# Patient Record
Sex: Male | Born: 2000 | Race: White | Hispanic: No | Marital: Single | Smoking: Never smoker
Health system: Southern US, Community
[De-identification: ages and names within clinical notes are randomized; demographics above are authoritative.]

## PROBLEM LIST (undated history)

## (undated) DIAGNOSIS — J45909 Unspecified asthma, uncomplicated: Secondary | ICD-10-CM

## (undated) DIAGNOSIS — G259 Extrapyramidal and movement disorder, unspecified: Secondary | ICD-10-CM

## (undated) DIAGNOSIS — Z8614 Personal history of Methicillin resistant Staphylococcus aureus infection: Secondary | ICD-10-CM

## (undated) DIAGNOSIS — R51 Headache: Secondary | ICD-10-CM

## (undated) DIAGNOSIS — K4090 Unilateral inguinal hernia, without obstruction or gangrene, not specified as recurrent: Secondary | ICD-10-CM

## (undated) HISTORY — PX: CIRCUMCISION: SUR203

## (undated) HISTORY — DX: Extrapyramidal and movement disorder, unspecified: G25.9

## (undated) HISTORY — DX: Headache: R51

---
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
## 2019-01-13 ENCOUNTER — Other Ambulatory Visit (HOSPITAL_COMMUNITY): Payer: Self-pay | Admitting: Family Medicine

## 2019-01-13 DIAGNOSIS — N5089 Other specified disorders of the male genital organs: Secondary | ICD-10-CM

## 2019-01-16 ENCOUNTER — Ambulatory Visit (HOSPITAL_COMMUNITY)
Admission: RE | Admit: 2019-01-16 | Discharge: 2019-01-16 | Disposition: A | Discharge status: Home / Self Care | Payer: No Typology Code available for payment source | Source: Ambulatory Visit | Attending: Family Medicine | Admitting: Family Medicine

## 2019-01-16 ENCOUNTER — Other Ambulatory Visit: Payer: Self-pay

## 2019-01-16 DIAGNOSIS — N5089 Other specified disorders of the male genital organs: Secondary | ICD-10-CM | POA: Insufficient documentation

## 2019-05-29 ENCOUNTER — Other Ambulatory Visit: Payer: No Typology Code available for payment source

## 2019-12-27 DIAGNOSIS — Z68.41 Body mass index (BMI) pediatric, less than 5th percentile for age: Secondary | ICD-10-CM | POA: Diagnosis not present

## 2019-12-27 DIAGNOSIS — J019 Acute sinusitis, unspecified: Secondary | ICD-10-CM | POA: Diagnosis not present

## 2020-01-26 ENCOUNTER — Ambulatory Visit
Admission: EM | Admit: 2020-01-26 | Discharge: 2020-01-26 | Disposition: A | Discharge status: Home / Self Care | Payer: No Typology Code available for payment source

## 2020-01-29 DIAGNOSIS — U071 COVID-19: Secondary | ICD-10-CM | POA: Diagnosis not present

## 2020-06-11 ENCOUNTER — Other Ambulatory Visit: Payer: Self-pay

## 2020-06-11 ENCOUNTER — Ambulatory Visit
Admission: EM | Admit: 2020-06-11 | Discharge: 2020-06-11 | Disposition: A | Discharge status: Home / Self Care | Payer: BC Managed Care – PPO | Attending: Emergency Medicine | Admitting: Emergency Medicine

## 2020-06-11 ENCOUNTER — Encounter: Payer: Self-pay | Admitting: Emergency Medicine

## 2020-06-11 DIAGNOSIS — Z20822 Contact with and (suspected) exposure to covid-19: Secondary | ICD-10-CM

## 2020-06-11 DIAGNOSIS — J029 Acute pharyngitis, unspecified: Secondary | ICD-10-CM | POA: Diagnosis not present

## 2020-06-11 LAB — POCT RAPID STREP A (OFFICE): Rapid Strep A Screen: NEGATIVE

## 2020-06-11 NOTE — Discharge Instructions (Signed)
Your rapid strep test is negative.  A throat culture is pending; we will call you if it is positive requiring treatment.    Your COVID test is pending.  You should self quarantine until the test result is back.    Take Tylenol or ibuprofen as needed for fever or discomfort.  Rest and keep yourself hydrated.    Follow-up with your primary care provider if your symptoms are not improving.     

## 2020-06-11 NOTE — ED Triage Notes (Signed)
Intake completed by provider °

## 2020-06-11 NOTE — ED Provider Notes (Signed)
Franklin Bryan    CSN: [id number] Arrival date & time: 06/11/20  1003      History   Chief Complaint Chief Complaint  Patient presents with  . Sore Throat    HPI Franklin Bryan is a 20 y.o. male.   Patient presents with a sore throat since this morning.  He also reports a mild headache.  He denies fever, chills, rash, cough, wheezing, shortness of breath, vomiting, diarrhea, or other symptoms.  No treatments attempted at home.  His medical history includes asthma, migraines, movement disorder/tic.  The history is provided by the patient and medical records.    Past Medical History:  Diagnosis Date  . Asthma    sports-induced; prn inhaler/neb.  Marland Kitchen Headache(784.0)   . History of MRSA infection    as a young child  . Inguinal hernia 07/2011   left  . Movement disorder     Patient Active Problem List   Diagnosis Date Noted  . Generalized headaches 04/20/2013  . Migraine, unspecified, without mention of intractable migraine without mention of status migrainosus 04/20/2013  . 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 COLLES' FRACTURE, LEFT WRIST 10/10/2009    Past Surgical History:  Procedure Laterality Date  . CIRCUMCISION    . INGUINAL HERNIA REPAIR  08/26/2011   Procedure: HERNIA REPAIR INGUINAL PEDIATRIC;  Surgeon: Judie Petit. Leonia Corona, MD;  Location: [facility];  Service: Pediatrics;  Laterality: Left;  FAXED SURGICAL REQUEST SHEET DID NOT ASK FOR LAP LOOK ON RIGHT       Home Medications    Prior to Admission medications   Medication Sig Start Date End Date Taking? Authorizing Provider  acetaminophen-codeine (TYLENOL #3) 300-30 MG tablet Take 1 tablet by mouth every 6 (six) hours as needed for moderate pain. 03/27/18   Burgess Amor, PA-C  albuterol (PROAIR HFA) 108 (90 BASE) MCG/ACT inhaler Inhale 2 puffs into the lungs every 6 (six) hours as needed.    [provider]  albuterol (PROVENTIL) (5 MG/ML) 0.5% nebulizer solution Take 2.5 mg by  nebulization every 6 (six) hours as needed.    [provider]  cloNIDine (CATAPRES) 0.1 MG tablet Take one tablet in the morning, may take a second tablet in the morning or evening as needed for recurrent tics. 10/29/15   Deetta Perla, MD  ibuprofen (ADVIL,MOTRIN) 600 MG tablet Take 1 tablet (600 mg total) by mouth every 6 (six) hours as needed. 03/27/18   Burgess Amor, PA-C    Family History Family History  Problem Relation Age of Onset  . Asthma Maternal Grandmother   . Hypertension Paternal Grandmother   . Heart disease Paternal Grandmother   . Diabetes Paternal Grandfather   . Hypertension Paternal Grandfather   . Pancreatic cancer Paternal Grandfather        Died at 7  . Learning disabilities Mother        Dyslexia  . Learning disabilities Father        Dyslexia  . SIDS Brother     Social History Social History   Tobacco Use  . Smoking status: Never Smoker  . Smokeless tobacco: Never Used  Substance Use Topics  . Alcohol use: No  . Drug use: No     Allergies   Patient has no known allergies.   Review of Systems Review of Systems  Constitutional: Negative for chills and fever.  HENT: Positive for sore throat. Negative for ear pain.   Respiratory: Negative for cough, shortness of  breath and wheezing.   Cardiovascular: Negative for chest pain and palpitations.  Gastrointestinal: Negative for abdominal pain, diarrhea and vomiting.  Skin: Negative for color change and rash.  Neurological: Positive for headaches. Negative for seizures and syncope.  All other systems reviewed and are negative.    Physical Exam Triage Vital Signs ED Triage Vitals  Enc Vitals Group     BP      Pulse      Resp      Temp      Temp src      SpO2      Weight      Height      Head Circumference      Peak Flow      Pain Score      Pain Loc      Pain Edu?      Excl. in GC?    No data found.  Updated Vital Signs BP 135/87   Pulse 99   Temp 99 F (37.2 C)    Resp 19   SpO2 95%   Visual Acuity Right Eye Distance:   Left Eye Distance:   Bilateral Distance:    Right Eye Near:   Left Eye Near:    Bilateral Near:     Physical Exam Vitals and nursing note reviewed.  Constitutional:      General: He is not in acute distress.    Appearance: He is well-developed. He is not ill-appearing.  HENT:     Head: Normocephalic and atraumatic.     Right Ear: Tympanic membrane normal.     Left Ear: Tympanic membrane normal.     Nose: Nose normal.     Mouth/Throat:     Mouth: Mucous membranes are moist.     Pharynx: Posterior oropharyngeal erythema present.  Eyes:     Conjunctiva/sclera: Conjunctivae normal.  Cardiovascular:     Rate and Rhythm: Normal rate and regular rhythm.     Heart sounds: Normal heart sounds.  Pulmonary:     Effort: Pulmonary effort is normal. No respiratory distress.     Breath sounds: Normal breath sounds.  Abdominal:     Palpations: Abdomen is soft.     Tenderness: There is no abdominal tenderness.  Musculoskeletal:     Cervical back: Neck supple.  Skin:    General: Skin is warm and dry.     Findings: No rash.  Neurological:     General: No focal deficit present.     Mental Status: He is alert and oriented to person, place, and time.     Gait: Gait normal.  Psychiatric:        Mood and Affect: Mood normal.        Behavior: Behavior normal.      UC Treatments / Results  Labs (all labs ordered are listed, but only abnormal results are displayed) Labs Reviewed  CULTURE, GROUP A STREP ([facility])  NOVEL CORONAVIRUS, NAA  POCT RAPID STREP A (OFFICE)    EKG   Radiology No results found.  Procedures Procedures (including critical care time)  Medications Ordered in UC Medications - No data to display  Initial Impression / Assessment and Plan / UC Course  I have reviewed the triage vital signs and the nursing notes.  Pertinent labs & imaging results that were available during my care of the patient  were reviewed by me and considered in my medical decision making (see chart for details).   Sore throat, COVID test.  Rapid strep negative; culture pending.  COVID pending.  Instructed patient to self quarantine until the test result is back.  Discussed symptomatic treatment including Tylenol or ibuprofen, rest, hydration.  Instructed patient to follow up with PCP if his symptoms are not improving.  Patient agrees to plan of care.    Final Clinical Impressions(s) / UC Diagnoses   Final diagnoses:  Sore throat  Encounter for laboratory testing for COVID-19 virus     Discharge Instructions     Your rapid strep test is negative.  A throat culture is pending; we will call you if it is positive requiring treatment.    Your COVID test is pending.  You should self quarantine until the test result is back.    Take Tylenol or ibuprofen as needed for fever or discomfort.  Rest and keep yourself hydrated.    Follow-up with your primary care provider if your symptoms are not improving.        ED Prescriptions    None     PDMP not reviewed this encounter.   Mickie Bail, NP 06/11/20 1049

## 2020-06-13 DIAGNOSIS — J019 Acute sinusitis, unspecified: Secondary | ICD-10-CM | POA: Diagnosis not present

## 2020-06-13 DIAGNOSIS — U071 COVID-19: Secondary | ICD-10-CM | POA: Diagnosis not present

## 2020-06-13 LAB — NOVEL CORONAVIRUS, NAA: SARS-CoV-2, NAA: DETECTED — AB

## 2020-06-13 LAB — SARS-COV-2, NAA 2 DAY TAT

## 2020-06-14 ENCOUNTER — Telehealth: Payer: Self-pay | Admitting: Physician Assistant

## 2020-06-14 LAB — CULTURE, GROUP A STREP (THRC)

## 2020-06-14 NOTE — Telephone Encounter (Signed)
Called to discuss with patient about COVID-19 symptoms and the use of one of the available treatments for those with mild to moderate Covid symptoms and at a high risk of hospitalization.  Pt appears to qualify for outpatient treatment due to co-morbid conditions and/or a member of an at-risk group in accordance with the FDA Emergency Use Authorization.    Patient is feeling well and symptoms almost resolved.  Preventive service reviewed.   [employer]

## 2020-08-26 DIAGNOSIS — Z681 Body mass index (BMI) 19 or less, adult: Secondary | ICD-10-CM | POA: Diagnosis not present

## 2020-08-26 DIAGNOSIS — J029 Acute pharyngitis, unspecified: Secondary | ICD-10-CM | POA: Diagnosis not present

## 2020-10-04 IMAGING — US US SCROTUM W/ DOPPLER COMPLETE
1 series · 14 of 25 positions shown · non-contrast
Comparison: None.

CLINICAL DATA: Left-sided scrotal region pain

EXAM:
SCROTAL ULTRASOUND
DOPPLER ULTRASOUND OF THE TESTICLES
TECHNIQUE: Complete ultrasound examination of the testicles, epididymis, and
other scrotal structures was performed. Color and spectral Doppler
ultrasound were also utilized to evaluate blood flow to the
testicles.

[Series 1: us scrotum w/ doppler complete · 14 of 55 slices shown]
[im 1/55]
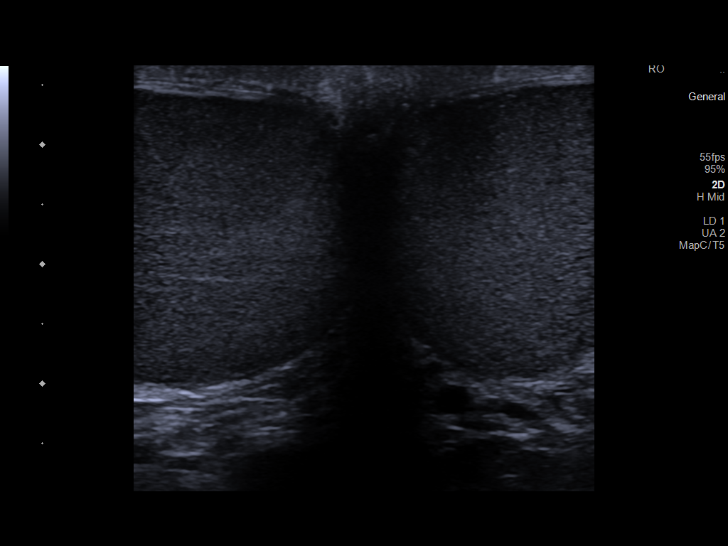
[im 5/55]
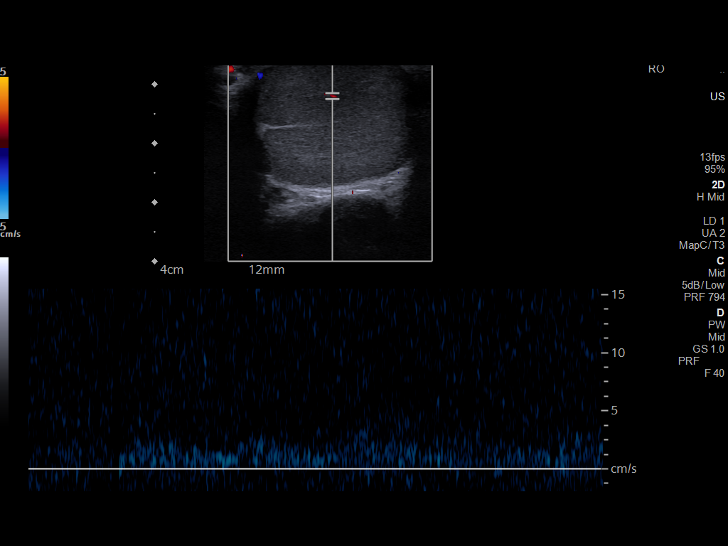
[im 10/55]
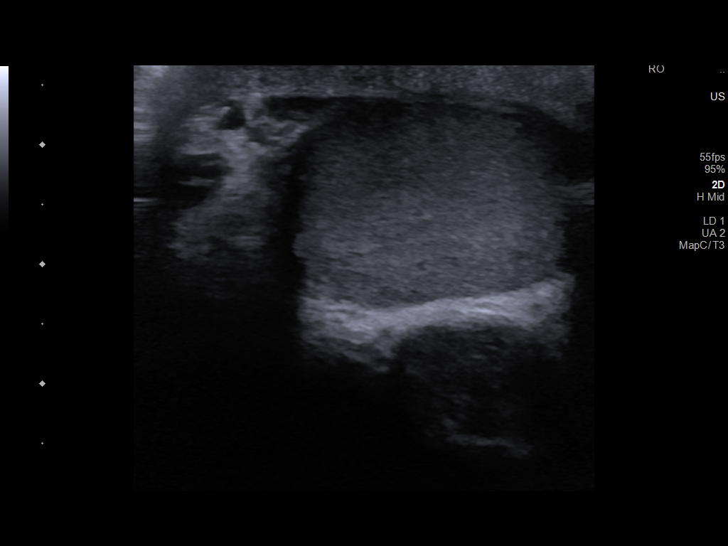
[im 14/55]
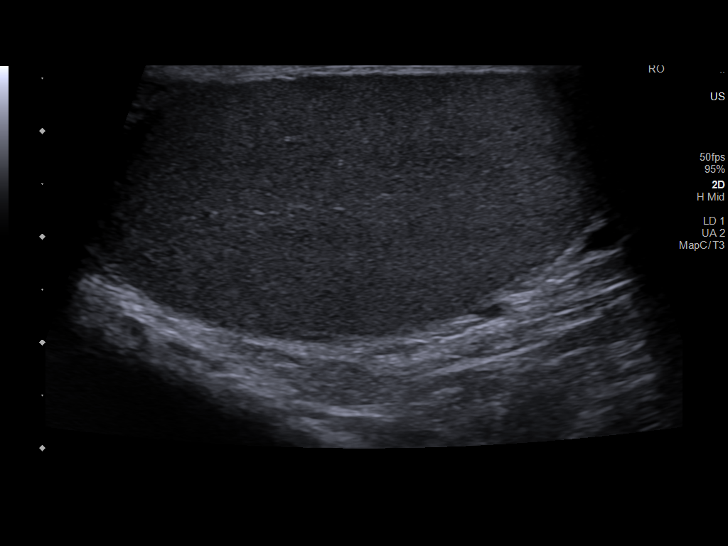
[im 19/55]
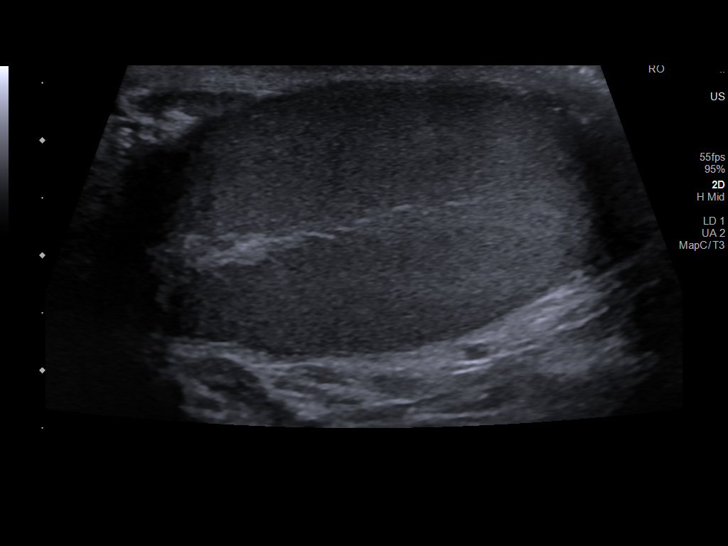
[im 21/55]
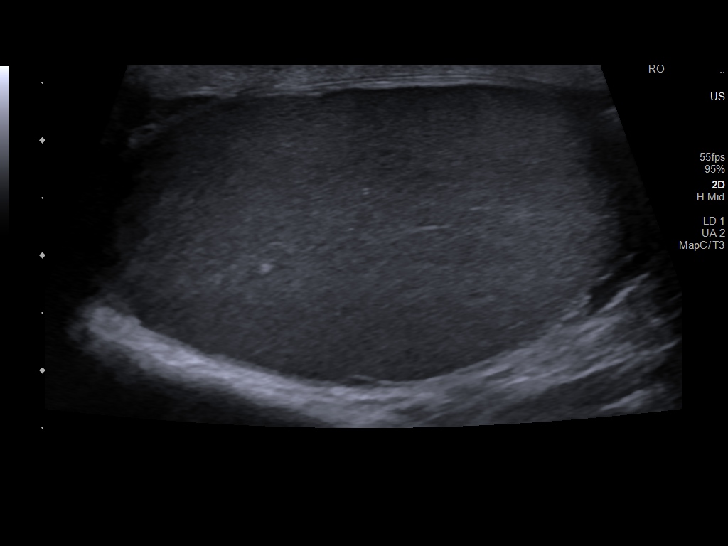
[im 25/55]
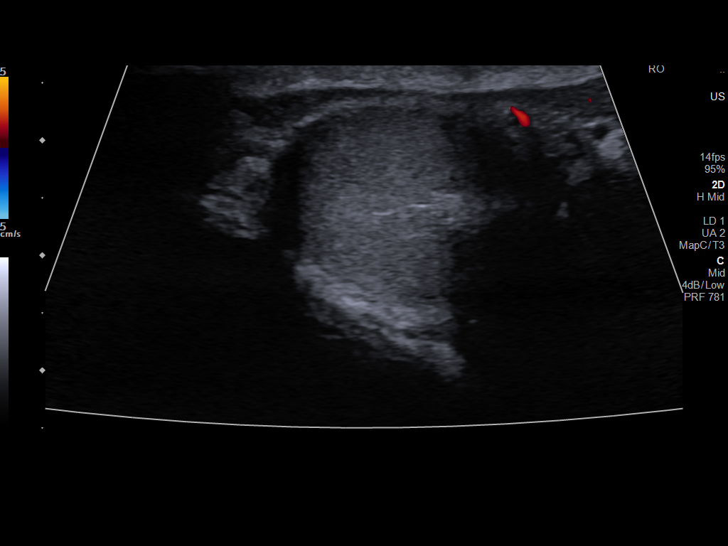
[im 30/55]
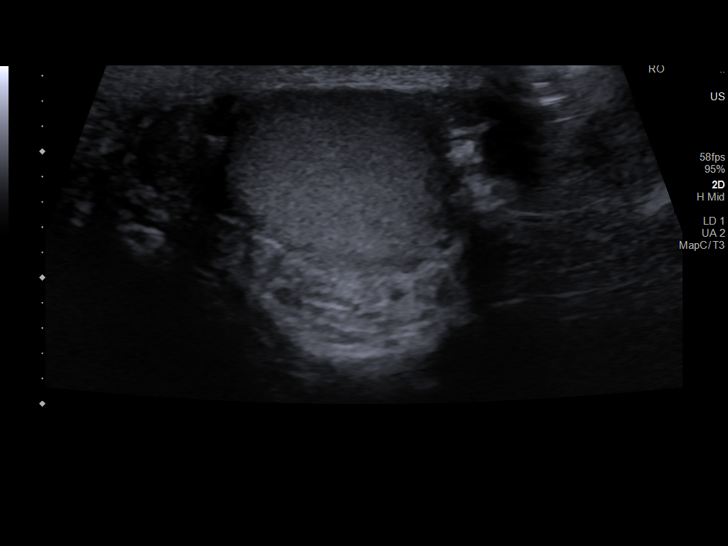
[im 34/55]
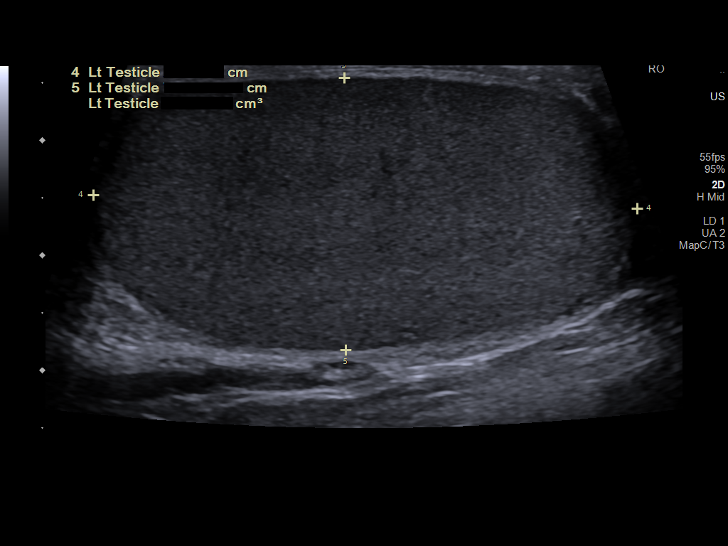
[im 37/55]
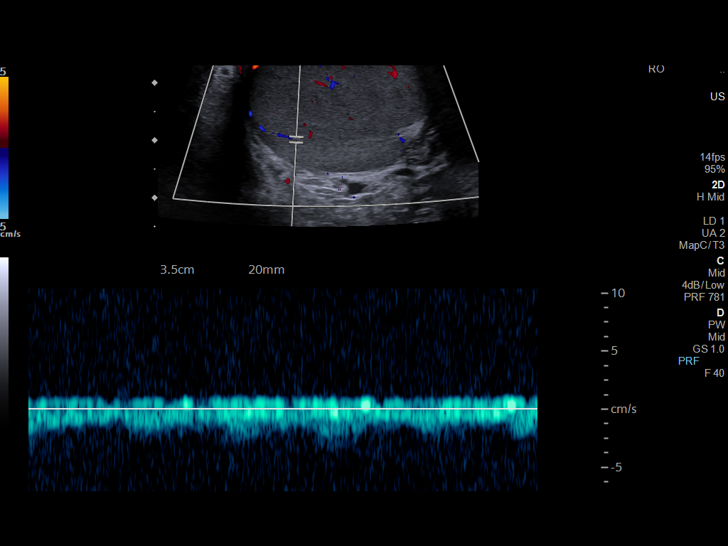
[im 41/55]
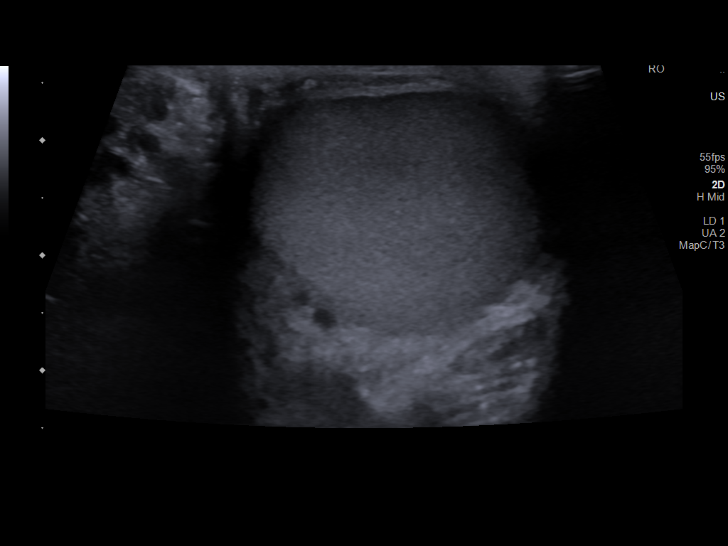
[im 46/55]
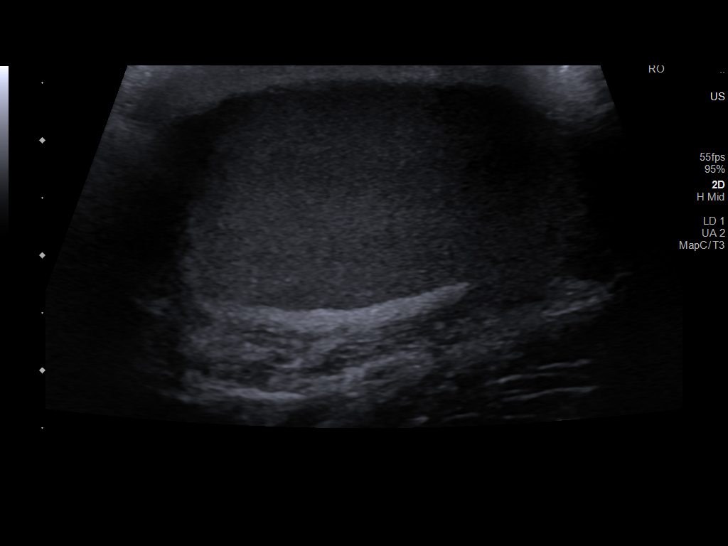
[im 50/55]
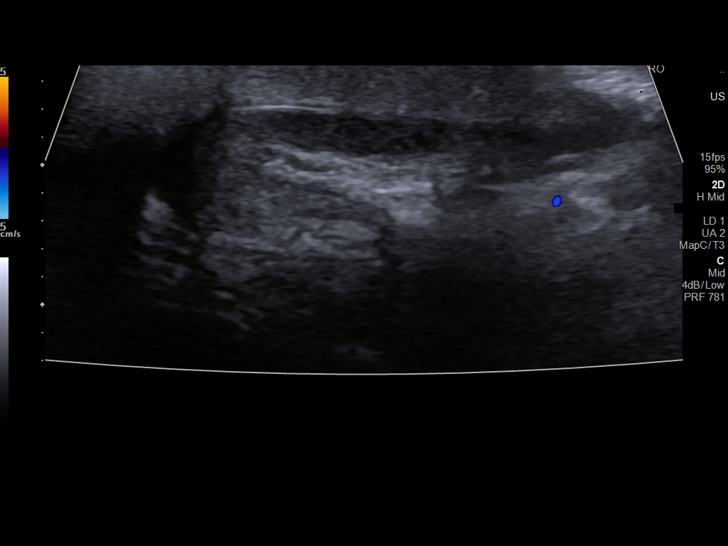
[im 55/55]
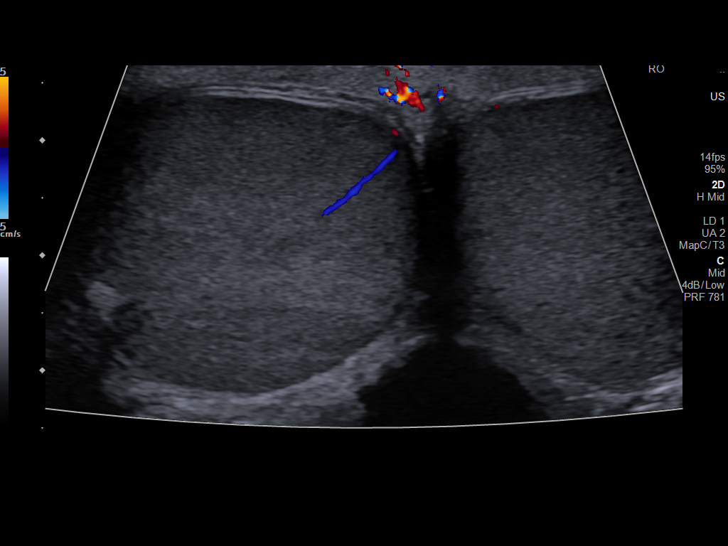

[14 of 25 positions shown; findings below may reference images not displayed]

FINDINGS: Right testicle

Measurements: 5.1 x 2.5 x 3.2 cm. No mass or microlithiasis
visualized.

Left testicle

Measurements: 4.7 x 2.4 x 3.1 cm. No mass or microlithiasis
visualized.

Right epididymis:  Normal in size and appearance.

Left epididymis:  Normal in size and appearance.

Hydrocele:  None visualized.

Varicocele:  None visualized.

Pulsed Doppler interrogation of both testes demonstrates normal low
resistance arterial and venous waveforms bilaterally.

No scrotal wall thickening or abscess.
IMPRESSION: 1. A cause for patient's symptoms has not been established with this
study.

2. No intratesticular or extratesticular mass or inflammatory focus
on either side. No evident testicular torsion.

3.  Study otherwise unremarkable.

## 2020-10-28 DIAGNOSIS — U071 COVID-19: Secondary | ICD-10-CM | POA: Diagnosis not present

## 2020-10-28 DIAGNOSIS — B356 Tinea cruris: Secondary | ICD-10-CM | POA: Diagnosis not present

## 2021-07-04 DIAGNOSIS — E663 Overweight: Secondary | ICD-10-CM | POA: Diagnosis not present

## 2021-07-04 DIAGNOSIS — Z6829 Body mass index (BMI) 29.0-29.9, adult: Secondary | ICD-10-CM | POA: Diagnosis not present

## 2021-07-04 DIAGNOSIS — R1030 Lower abdominal pain, unspecified: Secondary | ICD-10-CM | POA: Diagnosis not present

## 2021-07-15 ENCOUNTER — Ambulatory Visit: Payer: BC Managed Care – PPO | Admitting: General Surgery

## 2021-12-02 DIAGNOSIS — Z6828 Body mass index (BMI) 28.0-28.9, adult: Secondary | ICD-10-CM | POA: Diagnosis not present

## 2021-12-02 DIAGNOSIS — E782 Mixed hyperlipidemia: Secondary | ICD-10-CM | POA: Diagnosis not present

## 2021-12-02 DIAGNOSIS — Z0000 Encounter for general adult medical examination without abnormal findings: Secondary | ICD-10-CM | POA: Diagnosis not present

## 2021-12-02 DIAGNOSIS — Z1331 Encounter for screening for depression: Secondary | ICD-10-CM | POA: Diagnosis not present

## 2021-12-02 DIAGNOSIS — E663 Overweight: Secondary | ICD-10-CM | POA: Diagnosis not present

## 2023-03-15 DIAGNOSIS — J029 Acute pharyngitis, unspecified: Secondary | ICD-10-CM | POA: Diagnosis not present

## 2023-03-15 DIAGNOSIS — Z6828 Body mass index (BMI) 28.0-28.9, adult: Secondary | ICD-10-CM | POA: Diagnosis not present

## 2023-03-15 DIAGNOSIS — E6609 Other obesity due to excess calories: Secondary | ICD-10-CM | POA: Diagnosis not present

## 2023-03-15 DIAGNOSIS — Z20828 Contact with and (suspected) exposure to other viral communicable diseases: Secondary | ICD-10-CM | POA: Diagnosis not present

## 2023-03-15 DIAGNOSIS — J45909 Unspecified asthma, uncomplicated: Secondary | ICD-10-CM | POA: Diagnosis not present

## 2023-09-08 ENCOUNTER — Ambulatory Visit (HOSPITAL_COMMUNITY)
Admission: RE | Admit: 2023-09-08 | Discharge: 2023-09-08 | Disposition: A | Discharge status: Home / Self Care | Source: Ambulatory Visit | Attending: Family Medicine | Admitting: Family Medicine

## 2023-09-08 ENCOUNTER — Other Ambulatory Visit (HOSPITAL_COMMUNITY): Payer: Self-pay | Admitting: Family Medicine

## 2023-09-08 DIAGNOSIS — R6889 Other general symptoms and signs: Secondary | ICD-10-CM | POA: Diagnosis not present

## 2023-09-08 DIAGNOSIS — J45909 Unspecified asthma, uncomplicated: Secondary | ICD-10-CM | POA: Diagnosis not present

## 2023-09-08 DIAGNOSIS — Z20828 Contact with and (suspected) exposure to other viral communicable diseases: Secondary | ICD-10-CM | POA: Diagnosis not present

## 2023-09-08 DIAGNOSIS — R0602 Shortness of breath: Secondary | ICD-10-CM | POA: Diagnosis not present

## 2023-09-08 DIAGNOSIS — R059 Cough, unspecified: Secondary | ICD-10-CM | POA: Diagnosis not present

## 2023-09-08 DIAGNOSIS — Z6828 Body mass index (BMI) 28.0-28.9, adult: Secondary | ICD-10-CM | POA: Diagnosis not present
